# Patient Record
Sex: Male | Born: 1945 | Race: White | Hispanic: No | Marital: Married
Health system: Southern US, Community
[De-identification: ages and names within clinical notes are randomized; demographics above are authoritative.]

## PROBLEM LIST (undated history)

## (undated) DIAGNOSIS — G35 Multiple sclerosis: Secondary | ICD-10-CM

## (undated) DIAGNOSIS — F329 Major depressive disorder, single episode, unspecified: Secondary | ICD-10-CM

## (undated) DIAGNOSIS — M109 Gout, unspecified: Secondary | ICD-10-CM

## (undated) DIAGNOSIS — N200 Calculus of kidney: Secondary | ICD-10-CM

## (undated) DIAGNOSIS — F32A Depression, unspecified: Secondary | ICD-10-CM

## (undated) HISTORY — PX: HERNIA REPAIR: SHX51

## (undated) HISTORY — PX: EYE SURGERY: SHX253

## (undated) HISTORY — PX: TONSILLECTOMY: SUR1361

---

## 2016-05-23 ENCOUNTER — Emergency Department (HOSPITAL_BASED_OUTPATIENT_CLINIC_OR_DEPARTMENT_OTHER): Payer: Medicare HMO

## 2016-05-23 ENCOUNTER — Encounter (HOSPITAL_BASED_OUTPATIENT_CLINIC_OR_DEPARTMENT_OTHER): Payer: Self-pay | Admitting: Emergency Medicine

## 2016-05-23 ENCOUNTER — Emergency Department (HOSPITAL_BASED_OUTPATIENT_CLINIC_OR_DEPARTMENT_OTHER)
Admission: EM | Admit: 2016-05-23 | Discharge: 2016-05-24 | Disposition: A | Discharge status: Home / Self Care | Payer: Medicare HMO | Attending: Emergency Medicine | Admitting: Emergency Medicine

## 2016-05-23 DIAGNOSIS — R55 Syncope and collapse: Secondary | ICD-10-CM

## 2016-05-23 DIAGNOSIS — F17210 Nicotine dependence, cigarettes, uncomplicated: Secondary | ICD-10-CM | POA: Insufficient documentation

## 2016-05-23 DIAGNOSIS — Z7982 Long term (current) use of aspirin: Secondary | ICD-10-CM | POA: Diagnosis not present

## 2016-05-23 DIAGNOSIS — S0990XA Unspecified injury of head, initial encounter: Secondary | ICD-10-CM | POA: Diagnosis present

## 2016-05-23 DIAGNOSIS — Y929 Unspecified place or not applicable: Secondary | ICD-10-CM | POA: Diagnosis not present

## 2016-05-23 DIAGNOSIS — Y999 Unspecified external cause status: Secondary | ICD-10-CM | POA: Diagnosis not present

## 2016-05-23 DIAGNOSIS — S0101XA Laceration without foreign body of scalp, initial encounter: Secondary | ICD-10-CM | POA: Diagnosis not present

## 2016-05-23 DIAGNOSIS — Y939 Activity, unspecified: Secondary | ICD-10-CM | POA: Diagnosis not present

## 2016-05-23 DIAGNOSIS — X58XXXA Exposure to other specified factors, initial encounter: Secondary | ICD-10-CM | POA: Insufficient documentation

## 2016-05-23 HISTORY — DX: Calculus of kidney: N20.0

## 2016-05-23 HISTORY — DX: Major depressive disorder, single episode, unspecified: F32.9

## 2016-05-23 HISTORY — DX: Depression, unspecified: F32.A

## 2016-05-23 LAB — CBC WITH DIFFERENTIAL/PLATELET
BASOS ABS: 0 10*3/uL (ref 0.0–0.1)
BASOS PCT: 0 %
EOS ABS: 0.1 10*3/uL (ref 0.0–0.7)
EOS PCT: 1 %
HCT: 44.5 % (ref 39.0–52.0)
Hemoglobin: 15.3 g/dL (ref 13.0–17.0)
LYMPHS PCT: 18 %
Lymphs Abs: 1.5 10*3/uL (ref 0.7–4.0)
MCH: 31.9 pg (ref 26.0–34.0)
MCHC: 34.4 g/dL (ref 30.0–36.0)
MCV: 92.9 fL (ref 78.0–100.0)
Monocytes Absolute: 0.7 10*3/uL (ref 0.1–1.0)
Monocytes Relative: 9 %
Neutro Abs: 6 10*3/uL (ref 1.7–7.7)
Neutrophils Relative %: 72 %
PLATELETS: 230 10*3/uL (ref 150–400)
RBC: 4.79 MIL/uL (ref 4.22–5.81)
RDW: 13.8 % (ref 11.5–15.5)
WBC: 8.4 10*3/uL (ref 4.0–10.5)

## 2016-05-23 MED ORDER — LIDOCAINE HCL 1 % IJ SOLN
INTRAMUSCULAR | Status: AC
  Filled 2016-05-23: qty 10

## 2016-05-23 MED ORDER — LIDOCAINE HCL 1 % IJ SOLN: 10.0000 mL | Freq: Once | INTRAMUSCULAR | Status: DC

## 2016-05-23 NOTE — ED Provider Notes (Signed)
[facility] Provider Note   CSN: [id number] Arrival date & time: 05/23/16  2154   By signing my name below, I, Kevin Moyer, attest that this documentation has been prepared under the direction and in the presence of Geoffery Lyonsouglas Ladonna Vanorder, MD  Electronically Signed: Clovis PuAvnee Moyer, ED Scribe. 05/23/16. 11:24 PM.   History   Chief Complaint Chief Complaint  Patient presents with  . Loss of Consciousness   The history is provided by the patient and the spouse. No language interpreter was used.  Loss of Consciousness   This is a new problem. The current episode started 1 to 2 hours ago. Length of episode of loss of consciousness: unkown amount of time. The problem is associated with an unknown factor. Pertinent negatives include headaches. He has tried nothing for the symptoms. The treatment provided no relief.   HPI Comments:  Kevin Moyer is a 71 y.o. male who presents to the Emergency Department complaining of an acute onset, moderate laceration to his head s/p a syncopal episode which occurred PTA. Wife reports she heard a thump, thinks the pt hit his head on the nightstand and found him in the bedroom as he was tearing a rag to apply to his head. Pt does not remember what happened and states he awoke to find blood on his pillow. No alleviating factors noted. Pt denies a headache, neck pain, any other pain, blood thinner use and any other associated symptoms. No known drug allergies. Tetanus status unknown.   Past Medical History:  Diagnosis Date  . Depression   . Kidney calculi     There are no active problems to display for this patient.   Past Surgical History:  Procedure Laterality Date  . EYE SURGERY Bilateral     Home Medications    Prior to Admission medications   Medication Sig Start Date End Date Taking? Authorizing Provider  allopurinol (ZYLOPRIM) 100 MG tablet Take 100 mg by mouth daily.   Yes Historical Provider, MD  aspirin 81 MG chewable tablet Chew by  mouth daily.   Yes Historical Provider, MD  citalopram (CELEXA) 10 MG tablet Take 10 mg by mouth daily.   Yes Historical Provider, MD  pravastatin (PRAVACHOL) 10 MG tablet Take 10 mg by mouth daily.   Yes Historical Provider, MD  traZODone (DESYREL) 150 MG tablet Take by mouth at bedtime.   Yes Historical Provider, MD    Family History No family history on file.  Social History Social History  Substance Use Topics  . Smoking status: Current Every Day Smoker    Packs/day: 1.00    Types: Cigarettes  . Smokeless tobacco: Never Used  . Alcohol use Yes     Comment: daily     Allergies   Patient has no known allergies.   Review of Systems Review of Systems  Cardiovascular: Positive for syncope.  Musculoskeletal: Negative for neck pain.  Skin: Positive for wound.  Neurological: Positive for syncope. Negative for headaches.  All other systems reviewed and are negative.  Physical Exam Updated Vital Signs BP 138/88 (BP Location: Right Arm)   Pulse 92   Temp 97.9 F (36.6 C) (Oral)   Resp 19   Ht 5\' 8"  (1.727 m)   Wt 175 lb (79.4 kg)   SpO2 97%   BMI 26.61 kg/m   Physical Exam  Constitutional: He is oriented to person, place, and time. He appears well-developed and well-nourished.  HENT:  Head: Normocephalic.  6 cm laceration to the occiput oriented  vertically.   Eyes: EOM are normal. Pupils are equal, round, and reactive to light.  Neck: Normal range of motion.  No c-spine tenderness or step offs. Painless ROM in all directions  Cardiovascular: Normal rate, regular rhythm, normal heart sounds and intact distal pulses.   Pulmonary/Chest: Effort normal and breath sounds normal. No respiratory distress.  Abdominal: Soft. He exhibits no distension. There is no tenderness.  Musculoskeletal: Normal range of motion.  Neurological: He is alert and oriented to person, place, and time. No cranial nerve deficit. He exhibits normal muscle tone. Coordination normal.  Skin: Skin is  warm and dry.  Psychiatric: He has a normal mood and affect. Judgment normal.  Nursing note and vitals reviewed.  ED Treatments / Results  DIAGNOSTIC STUDIES:  Oxygen Saturation is 97% on RA, normal by my interpretation.    COORDINATION OF CARE:  11:00 PM Discussed treatment plan with pt at bedside and pt agreed to plan.  Labs (all labs ordered are listed, but only abnormal results are displayed) Labs Reviewed - No data to display  EKG  EKG Interpretation  Date/Time:  Saturday May 23 2016 22:07:45 EST Ventricular Rate:  89 PR Interval:  140 QRS Duration: 136 QT Interval:  416 QTC Calculation: 506 R Axis:   103 Text Interpretation:  Normal sinus rhythm Right bundle branch block Abnormal ECG Confirmed by Ahmira Boisselle  MD, Adalei Novell ([id number]) on 05/24/2016 12:08:32 AM       Radiology No results found.  Procedures .Marland KitchenLaceration Repair Date/Time: 05/23/2016 11:05 PM Performed by: Geoffery Lyons Authorized by: Geoffery Lyons   Consent:    Consent obtained:  Verbal   Consent given by:  Patient Anesthesia (see MAR for exact dosages):    Anesthesia method:  Local infiltration   Local anesthetic:  Lidocaine 1% w/o epi Laceration details:    Location:  Scalp   Scalp location:  Occipital   Length (cm):  6 Repair type:    Repair type:  Simple Pre-procedure details:    Preparation:  Patient was prepped and draped in usual sterile fashion Exploration:    Wound exploration: wound explored through full range of motion     Contaminated: no   Treatment:    Area cleansed with:  Betadine   Amount of cleaning:  Standard   Visualized foreign bodies/material removed: no   Skin repair:    Repair method:  Staples   Number of staples:  5 Approximation:    Approximation:  Close Post-procedure details:    Patient tolerance of procedure:  Tolerated well, no immediate complications    (including critical care time)  Medications Ordered in ED Medications  lidocaine (XYLOCAINE) 1 % (with  pres) injection 10 mL (not administered)     Initial Impression / Assessment and Plan / ED Course  I have reviewed the triage vital signs and the nursing notes.  Pertinent labs & imaging results that were available during my care of the patient were reviewed by me and considered in my medical decision making (see chart for details).  Patient presents with scalp laceration he sustained when hitting his head on the nightstand. He does not recall specifically what happened. He is neurologically intact in head CT is negative. Laboratory studies are unremarkable. He will be discharged, return as needed for any problems. Patient is here with his wife who will observe him. If he experiences further episodes or other problems, he will return for re-evaluation.  Final Clinical Impressions(s) / ED Diagnoses   Final diagnoses:  None  New Prescriptions New Prescriptions   No medications on file  I personally performed the services described in this documentation, which was scribed in my presence. The recorded information has been reviewed and is accurate.        Geoffery Lyons, MD 05/24/16 0010

## 2016-05-23 NOTE — ED Triage Notes (Signed)
Wife heard a "crash " and then found husband in BR and does not remember hitting head . Laceration noted to post head, jagged , bleeding controlled. Drsg applied. + LOC

## 2016-05-24 LAB — BASIC METABOLIC PANEL
Anion gap: 5 (ref 5–15)
BUN: 20 mg/dL (ref 6–20)
CHLORIDE: 107 mmol/L (ref 101–111)
CO2: 27 mmol/L (ref 22–32)
CREATININE: 1.04 mg/dL (ref 0.61–1.24)
Calcium: 9.4 mg/dL (ref 8.9–10.3)
GFR calc Af Amer: >60 mL/min (ref >60)
GFR calc non Af Amer: >60 mL/min (ref >60)
GLUCOSE: 106 mg/dL — AB (ref 65–99)
Potassium: 4.5 mmol/L (ref 3.5–5.1)
Sodium: 139 mmol/L (ref 135–145)

## 2016-05-24 NOTE — Discharge Instructions (Signed)
Staples are to remain in place for 1 week. Please follow-up with your doctor for removal.  Return to the emergency department for severe headache, redness around or pus draining from the wound, or other new and concerning symptoms.

## 2018-01-07 IMAGING — CT CT HEAD W/O CM
3 series · 15 of 47 positions shown, 18 images · non-contrast
Comparison: None.

CLINICAL DATA: Laceration.  Fall.

EXAM:
CT HEAD WITHOUT CONTRAST
TECHNIQUE: Contiguous axial images were obtained from the base of the skull
through the vertex without intravenous contrast.

[Series 2: head wo · axial · 0.49mm/px · z∈[+378,+508]mm · 9 of 32 slices shown, 12 images]
[im 3/32  brain]
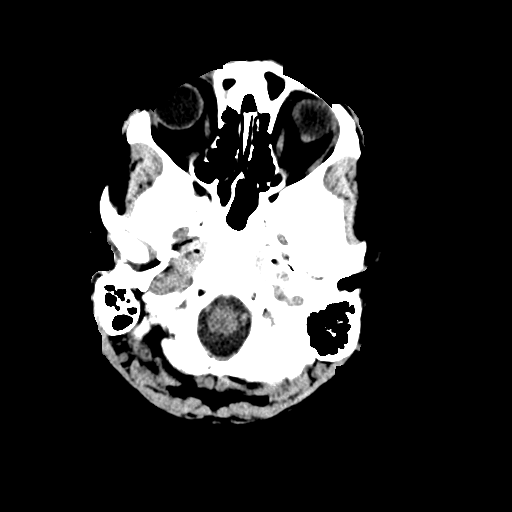
[im 3/32  bone]
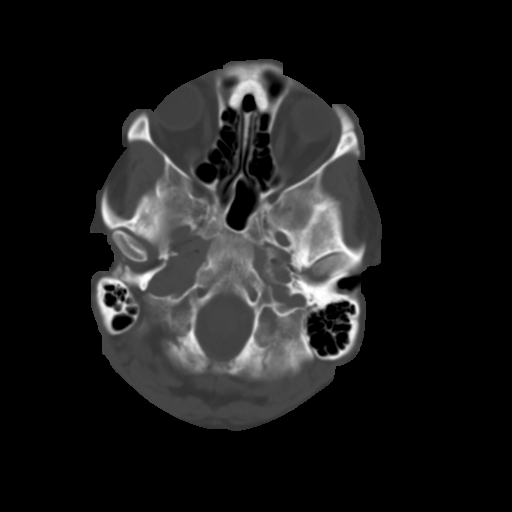
[im 6/32  brain]
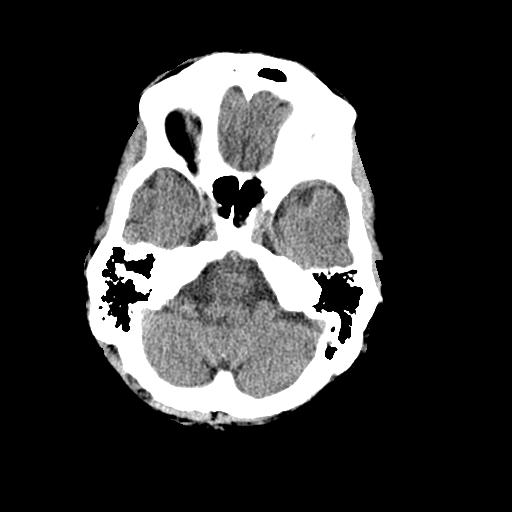
[im 9/32  brain]
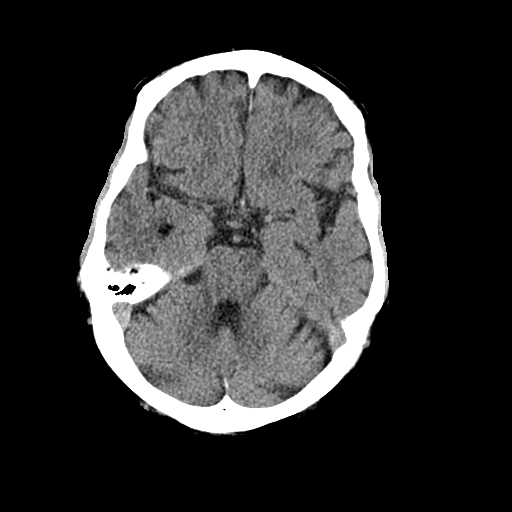
[im 12/32  brain]
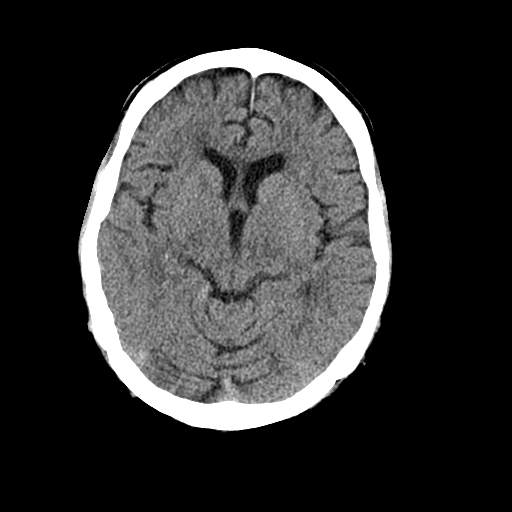
[im 17/32  brain]
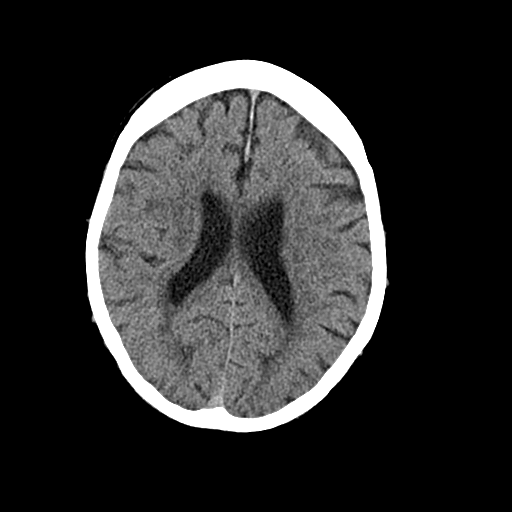
[im 17/32  bone]
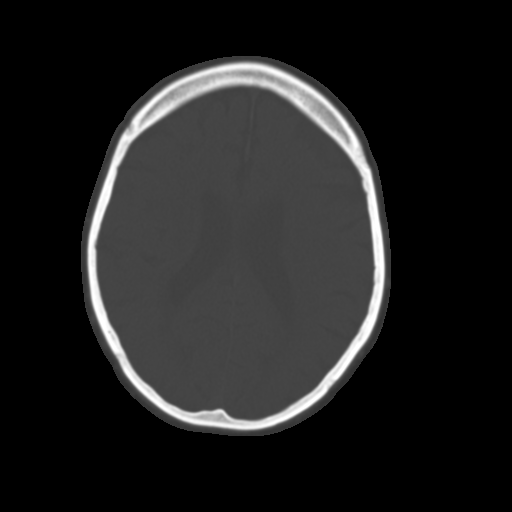
[im 20/32  brain]
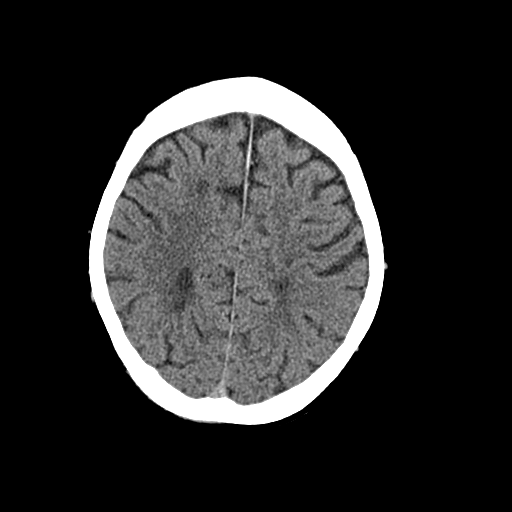
[im 23/32  brain]
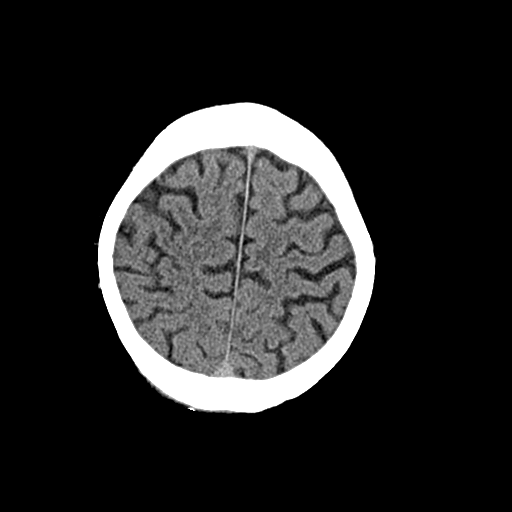
[im 26/32  brain]
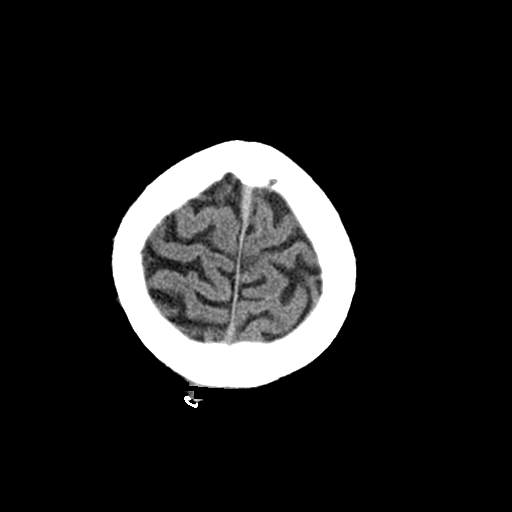
[im 29/32  brain]
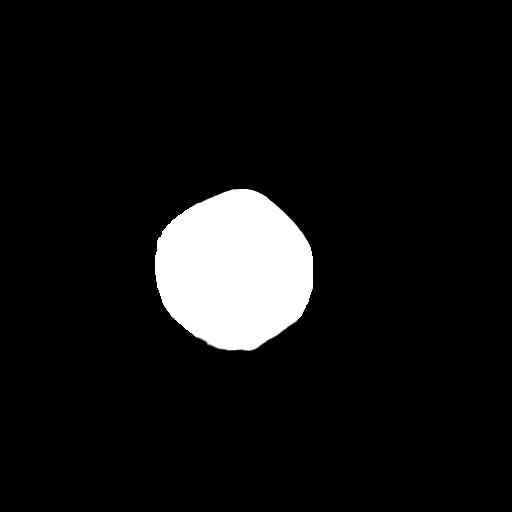
[im 29/32  bone]
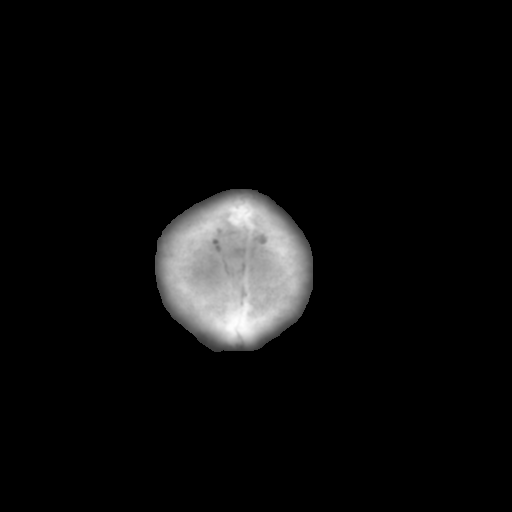

[Series 4: cor soft · coronal · 0.34mm/px · 3 of 84 slices shown]
[im 28/84  brain]
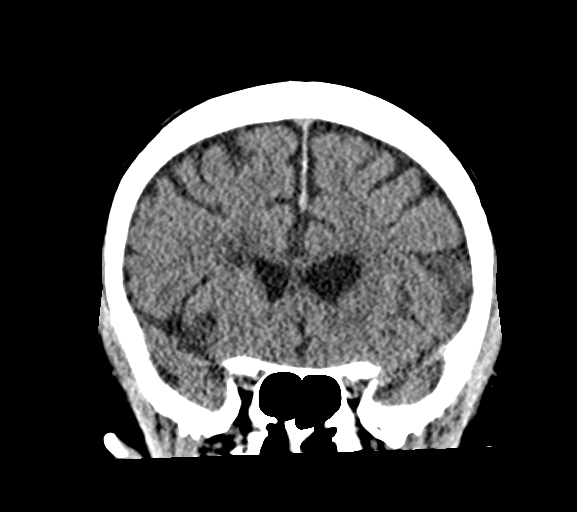
[im 37/84  brain]
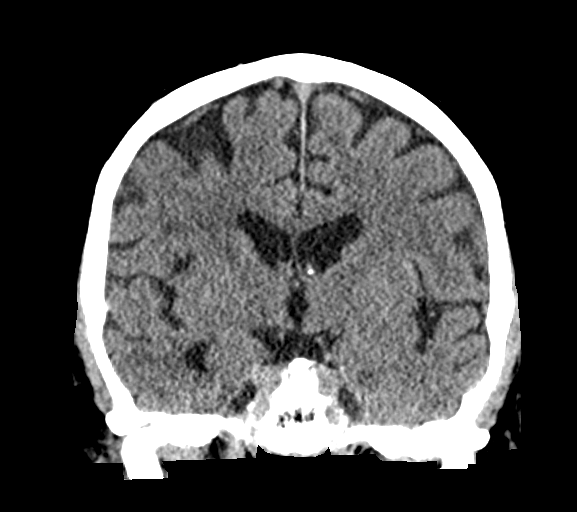
[im 47/84  brain]
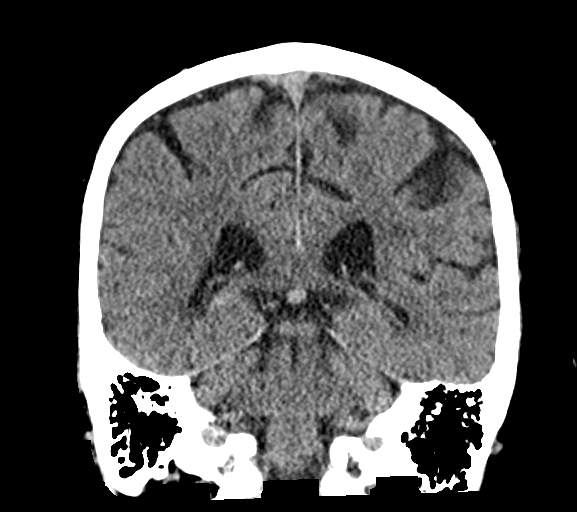

[Series 5: sag soft · sagittal · 0.35mm/px · 3 of 63 slices shown]
[im 21/63  brain]
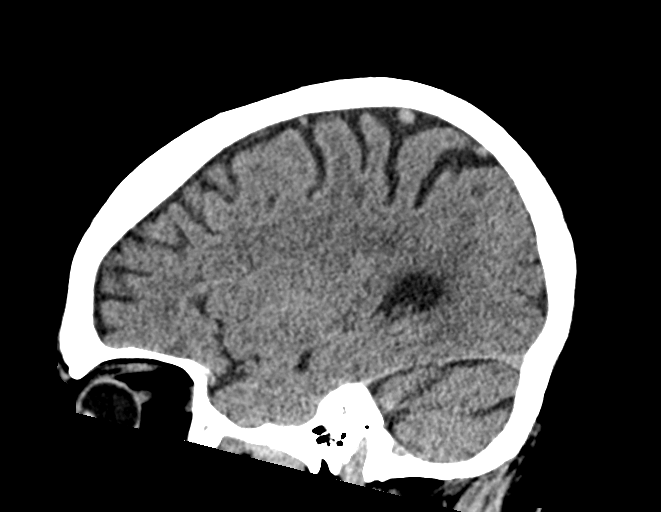
[im 32/63  brain]
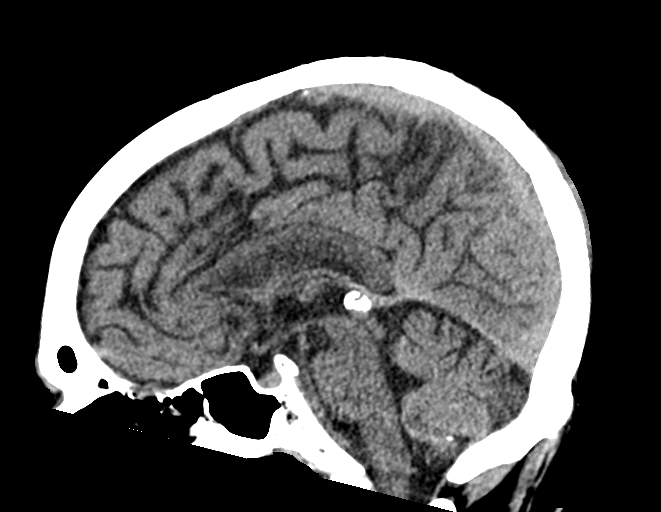
[im 42/63  brain]
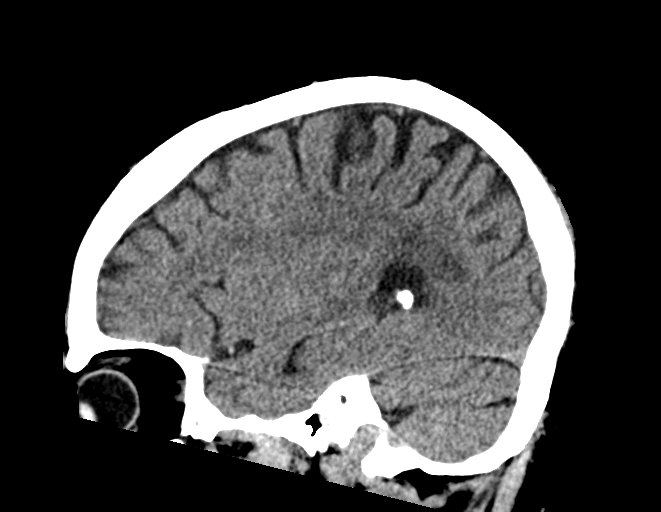

[15 of 47 positions shown; findings below may reference images not displayed]

FINDINGS: Brain: No subdural, epidural, or subarachnoid hemorrhage. The
cerebellum, brainstem, and basal cisterns are normal. Ventricles and
sulci are normal. Scattered white matter changes and lacunar
infarcts, particularly in the corona radiata, right greater than
left. No acute cortical ischemia or infarct identified. No mass,
mass effect, or midline shift.

Vascular: No hyperdense vessel or unexpected calcification.

Skull: Normal. Negative for fracture or focal lesion.

Sinuses/Orbits: No acute finding.

Other: Skin staples are seen over the posterior scalp, the right of
midline. Extracranial soft tissues are otherwise normal.
IMPRESSION: 1. No acute intracranial abnormality identified. White matter
changes as above.

## 2019-04-21 ENCOUNTER — Ambulatory Visit: Payer: Medicare HMO | Attending: Internal Medicine

## 2019-04-21 ENCOUNTER — Ambulatory Visit: Payer: Medicare HMO

## 2019-04-21 DIAGNOSIS — Z20822 Contact with and (suspected) exposure to covid-19: Secondary | ICD-10-CM | POA: Insufficient documentation

## 2019-04-22 LAB — NOVEL CORONAVIRUS, NAA: SARS-CoV-2, NAA: NOT DETECTED

## 2019-04-24 ENCOUNTER — Telehealth: Payer: Self-pay | Admitting: *Deleted

## 2019-04-24 NOTE — Telephone Encounter (Signed)
Pt had called for covid results. No indication of test performed.   NT called [employer], spoke with [employer], verbal given "Results, not detected." Attempted to CB pt, busy signal each attempt.

## 2019-04-24 NOTE — Telephone Encounter (Signed)
Attempted to reach at wife's number listed in demographics, VM not set up.

## 2019-04-25 ENCOUNTER — Telehealth: Payer: Self-pay | Admitting: General Practice

## 2019-04-25 NOTE — Telephone Encounter (Signed)
Negative COVID results given. Patient results "NOT Detected." Caller expressed understanding. ° °

## 2021-03-11 ENCOUNTER — Encounter (HOSPITAL_BASED_OUTPATIENT_CLINIC_OR_DEPARTMENT_OTHER): Payer: Self-pay

## 2021-03-11 ENCOUNTER — Emergency Department (HOSPITAL_BASED_OUTPATIENT_CLINIC_OR_DEPARTMENT_OTHER): Payer: Medicare HMO

## 2021-03-11 ENCOUNTER — Other Ambulatory Visit: Payer: Self-pay

## 2021-03-11 ENCOUNTER — Emergency Department (HOSPITAL_BASED_OUTPATIENT_CLINIC_OR_DEPARTMENT_OTHER)
Admission: EM | Admit: 2021-03-11 | Discharge: 2021-03-11 | Disposition: A | Discharge status: Home / Self Care | Payer: Medicare HMO | Attending: Emergency Medicine | Admitting: Emergency Medicine

## 2021-03-11 DIAGNOSIS — U071 COVID-19: Secondary | ICD-10-CM | POA: Diagnosis not present

## 2021-03-11 DIAGNOSIS — J069 Acute upper respiratory infection, unspecified: Secondary | ICD-10-CM

## 2021-03-11 DIAGNOSIS — F17210 Nicotine dependence, cigarettes, uncomplicated: Secondary | ICD-10-CM | POA: Insufficient documentation

## 2021-03-11 DIAGNOSIS — R0981 Nasal congestion: Secondary | ICD-10-CM | POA: Diagnosis present

## 2021-03-11 HISTORY — DX: Multiple sclerosis: G35

## 2021-03-11 LAB — RESP PANEL BY RT-PCR (FLU A&B, COVID) ARPGX2
Influenza A by PCR: NEGATIVE
Influenza B by PCR: NEGATIVE
SARS Coronavirus 2 by RT PCR: POSITIVE — AB

## 2021-03-11 MED ORDER — FLUTICASONE PROPIONATE 50 MCG/ACT NA SUSP: 2.0000 | Freq: Every day | NASAL | 0 refills | Status: AC

## 2021-03-11 MED ORDER — BENZONATATE 100 MG PO CAPS: 100.0000 mg | ORAL_CAPSULE | Freq: Three times a day (TID) | ORAL | 0 refills | Status: AC | PRN

## 2021-03-11 NOTE — ED Provider Notes (Signed)
Emergency Department Provider Note   I have reviewed the triage vital signs and the nursing notes.   HISTORY  Chief Complaint Cough   HPI Kevin Moyer is a 75 y.o. male with past medical history reviewed below presents emergency department with congestion along with cough and weakness.  Symptoms have been developing over the past 7 days.  Patient describes some subjective fever and chills along with weakness.  He is not feeling chest pain or shortness of breath.  No vomiting or diarrhea symptoms.  No radiation and no symptoms or other modifying factors.   Past Medical History:  Diagnosis Date   Depression    Kidney calculi    Multiple sclerosis (HCC)     There are no problems to display for this patient.   Past Surgical History:  Procedure Laterality Date   EYE SURGERY Bilateral     Allergies Patient has no known allergies.  No family history on file.  Social History Social History   Tobacco Use   Smoking status: Every Day    Packs/day: 1.00    Types: Cigarettes   Smokeless tobacco: Never  Substance Use Topics   Alcohol use: Yes    Comment: daily   Drug use: No    Review of Systems  Constitutional: Positive subjective fever/chills and weakness.  Eyes: No visual changes. ENT: No sore throat. Positive congestion.  Cardiovascular: Denies chest pain. Respiratory: Denies shortness of breath. Positive cough.  Gastrointestinal: No abdominal pain.  No nausea, no vomiting.  No diarrhea.  No constipation. Genitourinary: Negative for dysuria. Musculoskeletal: Negative for back pain. Skin: Negative for rash. Neurological: Negative for headaches, focal weakness or numbness.  10-point ROS otherwise negative.  ____________________________________________   PHYSICAL EXAM:  VITAL SIGNS: ED Triage Vitals  Enc Vitals Group     BP 03/11/21 0910 127/84     Pulse Rate 03/11/21 0910 88     Resp 03/11/21 0910 18     Temp 03/11/21 0910 98.5 F (36.9 C)      Temp Source 03/11/21 0910 Oral     SpO2 03/11/21 0910 100 %     Weight 03/11/21 0912 154 lb (69.9 kg)     Height 03/11/21 0912 5\' 8"  (1.727 m)   Constitutional: Alert and oriented. Well appearing and in no acute distress. Eyes: Conjunctivae are normal.  Head: Atraumatic. Nose: No congestion/rhinnorhea. Mouth/Throat: Mucous membranes are moist.   Neck: No stridor.  Cardiovascular: Normal rate, regular rhythm. Good peripheral circulation. Grossly normal heart sounds.   Respiratory: Normal respiratory effort.  No retractions. Lungs CTAB. Gastrointestinal: Soft and nontender. No distention.  Musculoskeletal: No lower extremity tenderness nor edema. No gross deformities of extremities. Neurologic:  Normal speech and language. No gross focal neurologic deficits are appreciated.  Skin:  Skin is warm, dry and intact. No rash noted.  ____________________________________________   LABS (all labs ordered are listed, but only abnormal results are displayed)  Labs Reviewed  RESP PANEL BY RT-PCR (FLU A&B, COVID) ARPGX2 - Abnormal; Notable for the following components:      Result Value   SARS Coronavirus 2 by RT PCR POSITIVE (*)    All other components within normal limits   ____________________________________________  RADIOLOGY  DG Chest Portable 1 View  Result Date: 03/11/2021 CLINICAL DATA:  productive cough EXAM: PORTABLE CHEST 1 VIEW COMPARISON:  11/27/2014 chest radiograph. FINDINGS: Stable cardiomediastinal silhouette with normal heart size. No pneumothorax. No pleural effusion. Lungs appear clear, with no acute consolidative airspace disease and no  pulmonary edema. IMPRESSION: No active disease. Electronically Signed   By: Delbert Phenix M.D.   On: 03/11/2021 10:00    ____________________________________________   PROCEDURES  Procedure(s) performed:   Procedures  None  ____________________________________________   INITIAL IMPRESSION / ASSESSMENT AND PLAN / ED  COURSE  Pertinent labs & imaging results that were available during my care of the patient were reviewed by me and considered in my medical decision making (see chart for details).   Patient presents emergency department with mild URI symptoms and flulike illness.  Chest x-ray reviewed showing no infiltrate to suggest community-acquired pneumonia.  We will send viral testing for COVID and flu which the patient will follow in the MyChart app.  He is on day 7 of symptoms and so is outside the window to benefit from antiviral therapy although at his age and with risk factors he would be a candidate if he had presented earlier.  He is in no acute distress and oxygen saturations are normal.  Plan for continued supportive care and quarantine at home pending the test results.   ____________________________________________  FINAL CLINICAL IMPRESSION(S) / ED DIAGNOSES  Final diagnoses:  Viral URI with cough    NEW OUTPATIENT MEDICATIONS STARTED DURING THIS VISIT:  Discharge Medication List as of 03/11/2021 10:23 AM     START taking these medications   Details  benzonatate (TESSALON) 100 MG capsule Take 1 capsule (100 mg total) by mouth 3 (three) times daily as needed for cough., Starting Tue 03/11/2021, Normal    fluticasone (FLONASE) 50 MCG/ACT nasal spray Place 2 sprays into both nostrils daily for 7 days., Starting Tue 03/11/2021, Until Tue 03/18/2021, Normal        Note:  This document was prepared using Dragon voice recognition software and may include unintentional dictation errors.  Alona Bene, MD, [facility]    Youssouf Shipley, Arlyss Repress, MD 03/12/21 [phone]

## 2021-03-11 NOTE — ED Triage Notes (Incomplete)
Congestion & cough x1 week Minimal relief with OTC meds

## 2021-03-11 NOTE — Discharge Instructions (Signed)
You are seen in the emerge department today with viral respiratory symptoms.  We are sending COVID and flu testing and we will get those results back later this afternoon.  The results will be available in the MyChart app.  Please follow closely with these test results and continue supportive care at home.  Return with any new or suddenly worsening symptoms.

## 2022-10-26 IMAGING — DX DG CHEST 1V PORT
2 series · 2 of 2 positions shown · non-contrast
Comparison: 11/27/2014 chest radiograph.

CLINICAL DATA: productive cough

EXAM:
PORTABLE CHEST 1 VIEW

[chest ap (1 of 2)]
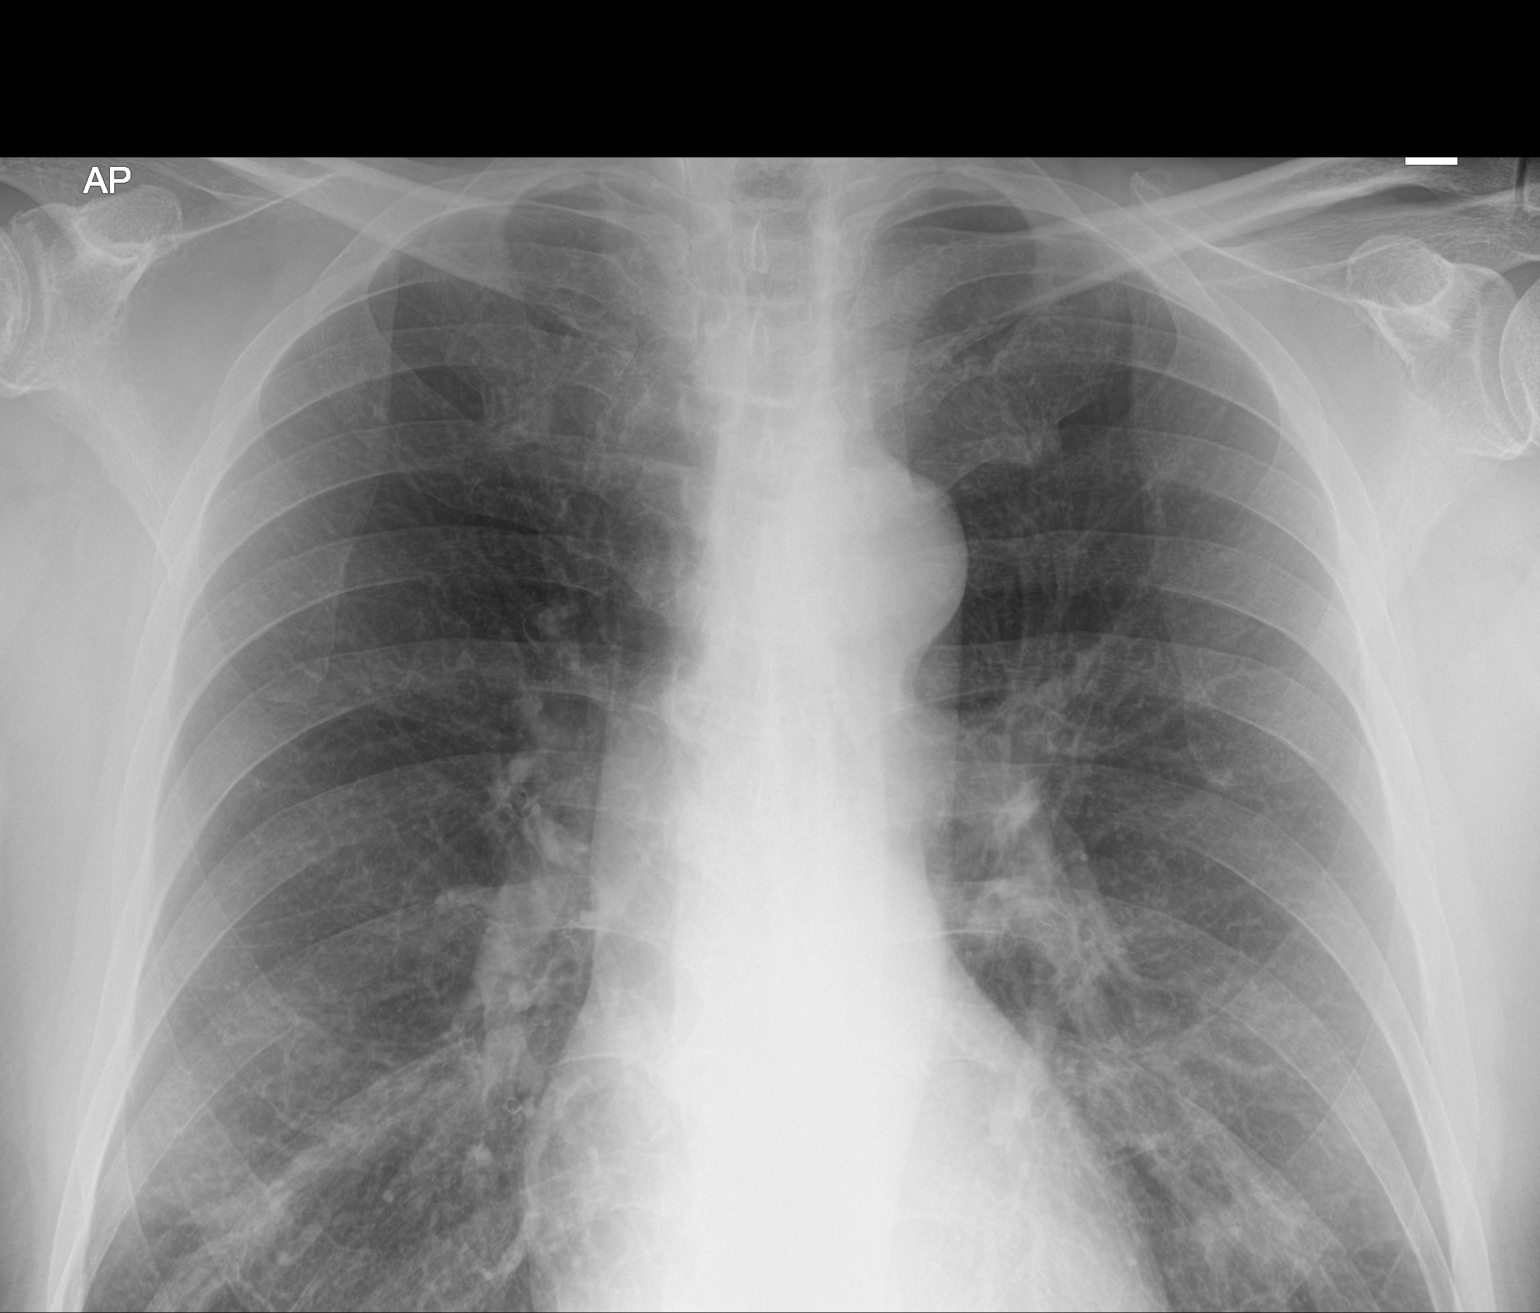

[chest ap (2 of 2)]
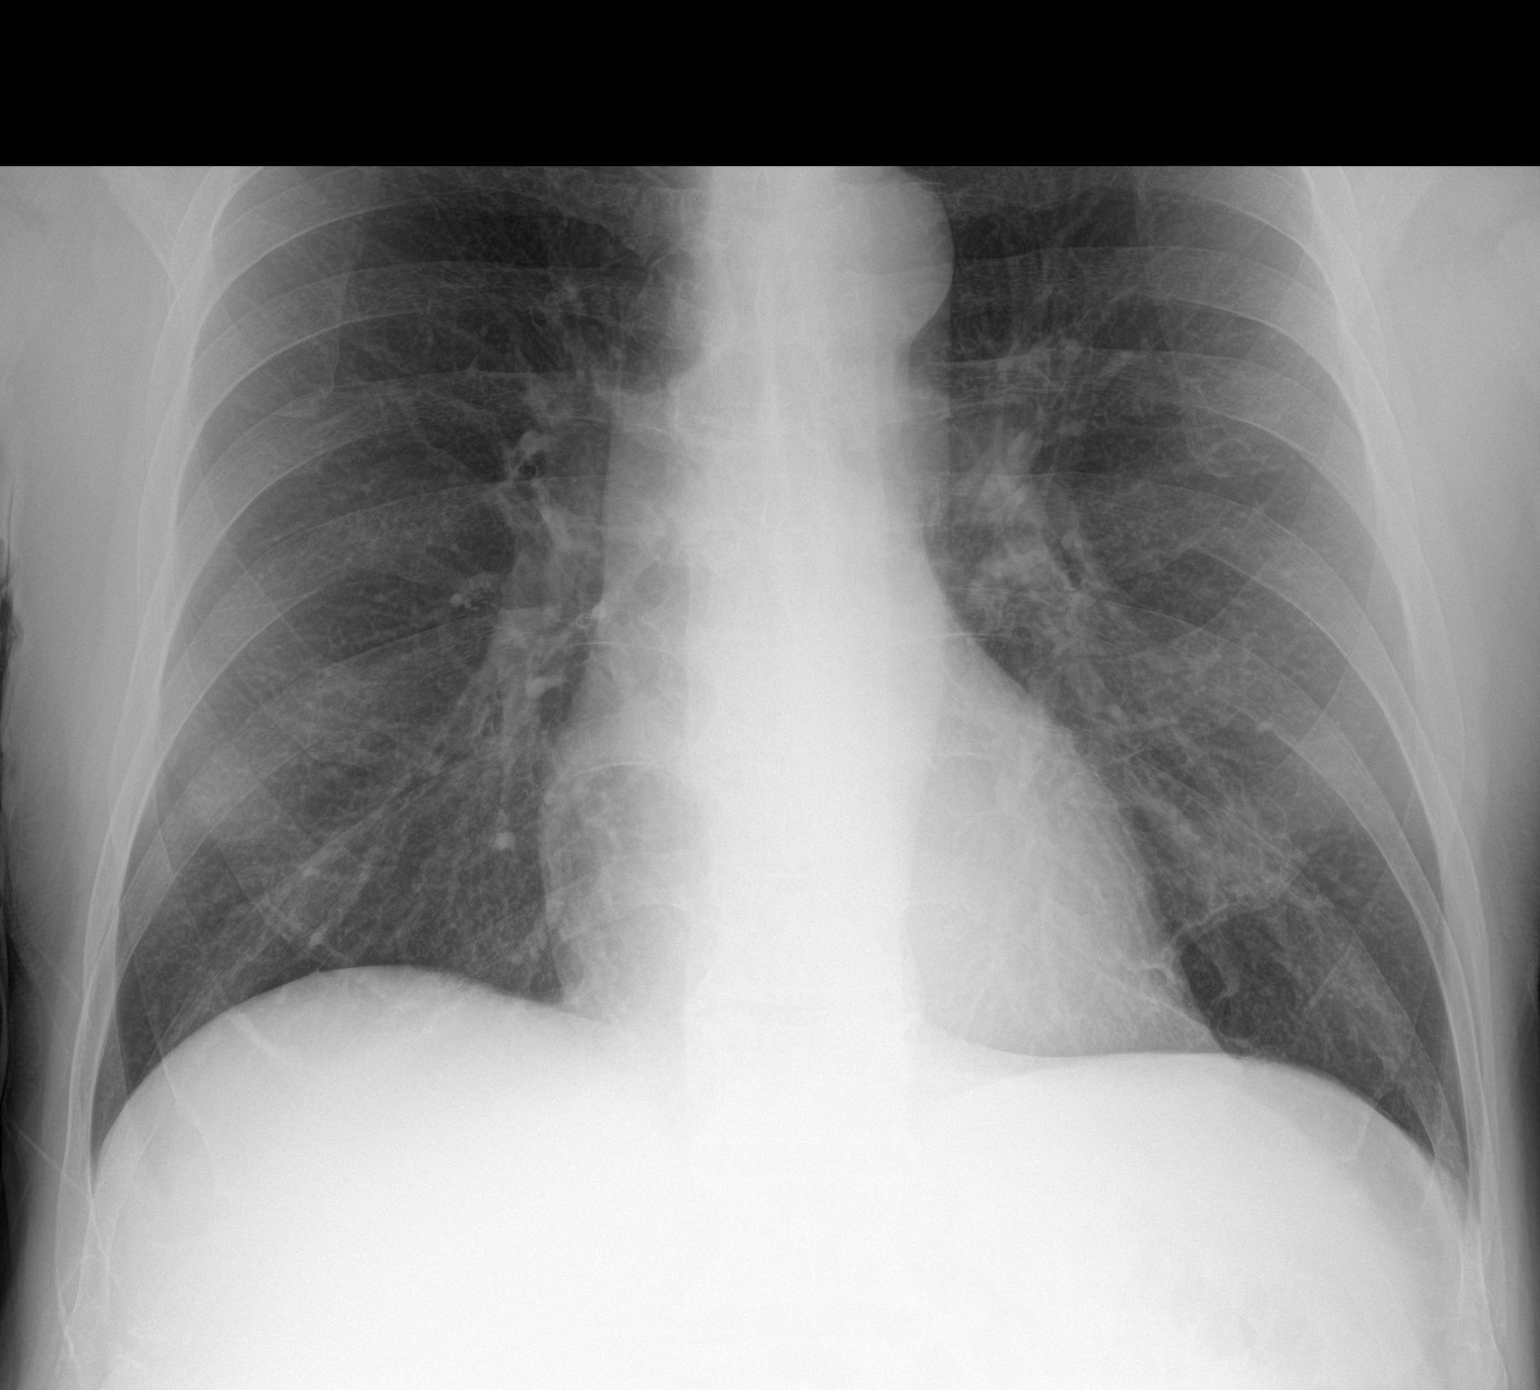

[2 of 2 positions shown; findings below may reference images not displayed]

FINDINGS: Stable cardiomediastinal silhouette with normal heart size. No
pneumothorax. No pleural effusion. Lungs appear clear, with no acute
consolidative airspace disease and no pulmonary edema.
IMPRESSION: No active disease.

## 2023-04-21 ENCOUNTER — Encounter: Payer: Self-pay | Admitting: Emergency Medicine

## 2023-04-21 ENCOUNTER — Ambulatory Visit: Payer: No Typology Code available for payment source | Admitting: Emergency Medicine

## 2023-04-21 VITALS — BP 130/76 | HR 57 | Ht 68.0 in | Wt 168.4 lb

## 2023-04-21 DIAGNOSIS — F329 Major depressive disorder, single episode, unspecified: Secondary | ICD-10-CM

## 2023-04-21 DIAGNOSIS — G35D Multiple sclerosis, unspecified: Secondary | ICD-10-CM | POA: Insufficient documentation

## 2023-04-21 DIAGNOSIS — R911 Solitary pulmonary nodule: Secondary | ICD-10-CM | POA: Insufficient documentation

## 2023-04-21 DIAGNOSIS — F32A Depression, unspecified: Secondary | ICD-10-CM | POA: Insufficient documentation

## 2023-04-21 DIAGNOSIS — G35 Multiple sclerosis: Secondary | ICD-10-CM

## 2023-04-21 NOTE — Assessment & Plan Note (Signed)
Depression No details provided in the conversation. -Continue current management as per primary care provider or psychiatrist.

## 2023-04-21 NOTE — Assessment & Plan Note (Signed)
Right Upper Lobe Pulmonary Nodule Incidental finding on MRI of thoracic spine for MS surveillance. CT chest confirmed a 2.2 cm right upper lobe nodule with associated satellite nodule, concerning for possible malignancy. High pretest suspicion for early stage lung cancer given 50 pack-year smoking history. No prior lung cancer screening CTs. No symptoms of cough or dyspnea. No history of TB or significant occupational exposures. Family history of lung cancer in sister. -Schedule bronchoscopy on 04/26/2023 to obtain tissue biopsy for histological confirmation and staging. -Stop aspirin prior to bronchoscopy. -Await PET scan results from [facility] for further staging and to guide definitive treatment plan.

## 2023-04-21 NOTE — H&P (View-Only) (Signed)
 Subjective:    Patient ID: Kevin Moyer, male    DOB: January 31, 1946, 78 y.o.   MRN: [id number]  HPI The patient is a 78 year old male who presents with an abnormal CT scan of the chest. He is accompanied by his daughter, Wynona Canes. He was referred from the [place] for an abnormal CT scan of the chest.  A CT scan performed on April 06, 2023, revealed a 2.2 cm right upper lobe nodule with an associated satellite nodule. This finding was incidental, discovered during an MRI of the thoracic spine conducted for MS surveillance. He has no prior history of lung nodules and was scheduled for lung cancer screening in May, which would have been his first.  He has a significant smoking history, having smoked a pack a day for 50 years, but quit smoking two weeks prior to the discovery of the nodule. He has no history of COPD or breathing difficulties and is not on any inhaled medications. No significant cough and maintains good functional capacity, including daily gym visits and swimming.  His past medical history includes multiple sclerosis (MS), diagnosed eight years ago, with no flare-ups since diagnosis. The recent MRI confirmed the presence of MS but showed it to be stable. He also has a history of depression and renal calculi.  Family history is notable for his father who died of lung cancer, a sister who died of pancreatic cancer, and another sister who had lung cancer and underwent partial lung resection.  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 He reports no significant occupational exposures to fumes, dust, chemicals, or solvents. He has traveled extensively, including to [place], but reports no known exposures to harmful substances during his travels. He has no history of tuberculosis or positive TB tests.  Review of systems is negative for breathing trouble, significant cough, or any symptoms suggestive of  respiratory distress. He is not on any medications for respiratory issues.   RADIOLOGY CT chest: 2.2 cm right upper lobe nodule with associated satellite nodule (04/06/2023) MRI thoracic spine: Right upper lobe mass (04/06/2023)   Review of Systems As per HPI  Past Medical History:  Diagnosis Date   Depression    Kidney calculi    Multiple sclerosis (HCC)      Family Hx: Family history is notable for his father who died of lung cancer, a sister who died of pancreatic cancer, and another sister who had lung cancer and underwent partial lung resection. - Father (lung cancer) - Father (melanoma) - Sister (pancreatic cancer) - Sister (lung cancer, had lobectomy) - Mother (sarcoidosis)  Social History   Socioeconomic History   Marital status: Married    Spouse name: Not on file   Number of children: Not on file   Years of education: Not on file   Highest education level: Not on file  Occupational History   Not on file  Tobacco Use   Smoking status: Former    Types: Cigarettes   Smokeless tobacco: Never   Tobacco comments:    Quit smoking 1/25  Substance and Sexual Activity   Alcohol use: Yes    Comment: daily   Drug use: No   Sexual activity: Not on file  Other Topics Concern   Not on file  Social History Narrative   Not on file   Social Drivers of Health   Financial Resource Strain: Not on file  Food Insecurity: Not  on file  Transportation Needs: Not on file  Physical Activity: Not on file  Stress: Not on file  Social Connections: Not on file  Intimate Partner Violence: Not on file     No Known Allergies   Outpatient Medications Prior to Visit  Medication Sig Dispense Refill   allopurinol (ZYLOPRIM) 100 MG tablet Take 100 mg by mouth daily.     buPROPion (WELLBUTRIN XL) 150 MG 24 hr tablet Take 150 mg by mouth daily.     citalopram (CELEXA) 10 MG tablet Take 10 mg by mouth daily.     pravastatin (PRAVACHOL) 10 MG tablet Take 10 mg by mouth daily.      tiZANidine (ZANAFLEX) 4 MG tablet Take 1 tablet by mouth at bedtime as needed.     traZODone (DESYREL) 150 MG tablet Take by mouth at bedtime.     aspirin 81 MG chewable tablet Chew by mouth daily. (Patient not taking: Reported on 04/21/2023)     benzonatate (TESSALON) 100 MG capsule Take 1 capsule (100 mg total) by mouth 3 (three) times daily as needed for cough. (Patient not taking: Reported on 04/21/2023) 21 capsule 0   fluticasone (FLONASE) 50 MCG/ACT nasal spray Place 2 sprays into both nostrils daily for 7 days. 1 g 0   No facility-administered medications prior to visit.        Objective:   Physical Exam Vitals:   04/21/23 1505  BP: 130/76  Pulse: (!) 57  SpO2: 96%  Weight: 168 lb 6.4 oz (76.4 kg)  Height: 5\' 8"  (1.727 m)    Gen: Pleasant, well-nourished, in no distress,  normal affect  ENT: No lesions,  mouth clear,  oropharynx clear, no postnasal drip  Neck: No JVD, no stridor  Lungs: No use of accessory muscles, no crackles or wheezing on normal respiration, no wheeze on forced expiration  Cardiovascular: RRR, heart sounds normal, no murmur or gallops, no peripheral edema  Musculoskeletal: No deformities, no cyanosis or clubbing  Neuro: alert, awake, non focal  Skin: Warm, no lesions or rash       Assessment & Plan:   Pulmonary nodule 1 cm or greater in diameter Right Upper Lobe Pulmonary Nodule Incidental finding on MRI of thoracic spine for MS surveillance. CT chest confirmed a 2.2 cm right upper lobe nodule with associated satellite nodule, concerning for possible malignancy. High pretest suspicion for early stage lung cancer given 50 pack-year smoking history. No prior lung cancer screening CTs. No symptoms of cough or dyspnea. No history of TB or significant occupational exposures. Family history of lung cancer in sister. -Schedule bronchoscopy on 04/26/2023 to obtain tissue biopsy for histological confirmation and staging. -Stop aspirin prior to  bronchoscopy. -Await PET scan results from [facility] for further staging and to guide definitive treatment plan.     Multiple sclerosis (HCC) Multiple Sclerosis Stable with no recent flare-ups. Recent MRI confirmed diagnosis. -Continue current management as per neurologist.  Depression Depression No details provided in the conversation. -Continue current management as per primary care provider or psychiatrist.   Levy Pupa, MD, PhD 04/21/2023, 4:48 PM [facility] [phone] or if no answer before 7:00PM call [phone] For any issues after 7:00PM please call eLink [phone]

## 2023-04-21 NOTE — Patient Instructions (Signed)
We reviewed your CT scan of the chest today. We will arrange for navigational bronchoscopy to evaluate a right upper lobe pulmonary nodule.  This will be done under general anesthesia as an outpatient at [facility] endoscopy.  Will try to get this scheduled for 04/26/2023. [facility] We will arrange for follow-up in our office to review your biopsy results.

## 2023-04-21 NOTE — Assessment & Plan Note (Signed)
Multiple Sclerosis Stable with no recent flare-ups. Recent MRI confirmed diagnosis. -Continue current management as per neurologist.

## 2023-04-21 NOTE — Progress Notes (Signed)
Subjective:    Patient ID: Kevin Moyer, male    DOB: January 31, 1946, 78 y.o.   MRN: [id number]  HPI The patient is a 78 year old male who presents with an abnormal CT scan of the chest. He is accompanied by his daughter, Wynona Canes. He was referred from the [place] for an abnormal CT scan of the chest.  A CT scan performed on April 06, 2023, revealed a 2.2 cm right upper lobe nodule with an associated satellite nodule. This finding was incidental, discovered during an MRI of the thoracic spine conducted for MS surveillance. He has no prior history of lung nodules and was scheduled for lung cancer screening in May, which would have been his first.  He has a significant smoking history, having smoked a pack a day for 50 years, but quit smoking two weeks prior to the discovery of the nodule. He has no history of COPD or breathing difficulties and is not on any inhaled medications. No significant cough and maintains good functional capacity, including daily gym visits and swimming.  His past medical history includes multiple sclerosis (MS), diagnosed eight years ago, with no flare-ups since diagnosis. The recent MRI confirmed the presence of MS but showed it to be stable. He also has a history of depression and renal calculi.  Family history is notable for his father who died of lung cancer, a sister who died of pancreatic cancer, and another sister who had lung cancer and underwent partial lung resection.  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 He reports no significant occupational exposures to fumes, dust, chemicals, or solvents. He has traveled extensively, including to [place], but reports no known exposures to harmful substances during his travels. He has no history of tuberculosis or positive TB tests.  Review of systems is negative for breathing trouble, significant cough, or any symptoms suggestive of  respiratory distress. He is not on any medications for respiratory issues.   RADIOLOGY CT chest: 2.2 cm right upper lobe nodule with associated satellite nodule (04/06/2023) MRI thoracic spine: Right upper lobe mass (04/06/2023)   Review of Systems As per HPI  Past Medical History:  Diagnosis Date   Depression    Kidney calculi    Multiple sclerosis (HCC)      Family Hx: Family history is notable for his father who died of lung cancer, a sister who died of pancreatic cancer, and another sister who had lung cancer and underwent partial lung resection. - Father (lung cancer) - Father (melanoma) - Sister (pancreatic cancer) - Sister (lung cancer, had lobectomy) - Mother (sarcoidosis)  Social History   Socioeconomic History   Marital status: Married    Spouse name: Not on file   Number of children: Not on file   Years of education: Not on file   Highest education level: Not on file  Occupational History   Not on file  Tobacco Use   Smoking status: Former    Types: Cigarettes   Smokeless tobacco: Never   Tobacco comments:    Quit smoking 1/25  Substance and Sexual Activity   Alcohol use: Yes    Comment: daily   Drug use: No   Sexual activity: Not on file  Other Topics Concern   Not on file  Social History Narrative   Not on file   Social Drivers of Health   Financial Resource Strain: Not on file  Food Insecurity: Not  on file  Transportation Needs: Not on file  Physical Activity: Not on file  Stress: Not on file  Social Connections: Not on file  Intimate Partner Violence: Not on file     No Known Allergies   Outpatient Medications Prior to Visit  Medication Sig Dispense Refill   allopurinol (ZYLOPRIM) 100 MG tablet Take 100 mg by mouth daily.     buPROPion (WELLBUTRIN XL) 150 MG 24 hr tablet Take 150 mg by mouth daily.     citalopram (CELEXA) 10 MG tablet Take 10 mg by mouth daily.     pravastatin (PRAVACHOL) 10 MG tablet Take 10 mg by mouth daily.      tiZANidine (ZANAFLEX) 4 MG tablet Take 1 tablet by mouth at bedtime as needed.     traZODone (DESYREL) 150 MG tablet Take by mouth at bedtime.     aspirin 81 MG chewable tablet Chew by mouth daily. (Patient not taking: Reported on 04/21/2023)     benzonatate (TESSALON) 100 MG capsule Take 1 capsule (100 mg total) by mouth 3 (three) times daily as needed for cough. (Patient not taking: Reported on 04/21/2023) 21 capsule 0   fluticasone (FLONASE) 50 MCG/ACT nasal spray Place 2 sprays into both nostrils daily for 7 days. 1 g 0   No facility-administered medications prior to visit.        Objective:   Physical Exam Vitals:   04/21/23 1505  BP: 130/76  Pulse: (!) 57  SpO2: 96%  Weight: 168 lb 6.4 oz (76.4 kg)  Height: 5\' 8"  (1.727 m)    Gen: Pleasant, well-nourished, in no distress,  normal affect  ENT: No lesions,  mouth clear,  oropharynx clear, no postnasal drip  Neck: No JVD, no stridor  Lungs: No use of accessory muscles, no crackles or wheezing on normal respiration, no wheeze on forced expiration  Cardiovascular: RRR, heart sounds normal, no murmur or gallops, no peripheral edema  Musculoskeletal: No deformities, no cyanosis or clubbing  Neuro: alert, awake, non focal  Skin: Warm, no lesions or rash       Assessment & Plan:   Pulmonary nodule 1 cm or greater in diameter Right Upper Lobe Pulmonary Nodule Incidental finding on MRI of thoracic spine for MS surveillance. CT chest confirmed a 2.2 cm right upper lobe nodule with associated satellite nodule, concerning for possible malignancy. High pretest suspicion for early stage lung cancer given 50 pack-year smoking history. No prior lung cancer screening CTs. No symptoms of cough or dyspnea. No history of TB or significant occupational exposures. Family history of lung cancer in sister. -Schedule bronchoscopy on 04/26/2023 to obtain tissue biopsy for histological confirmation and staging. -Stop aspirin prior to  bronchoscopy. -Await PET scan results from [facility] for further staging and to guide definitive treatment plan.     Multiple sclerosis (HCC) Multiple Sclerosis Stable with no recent flare-ups. Recent MRI confirmed diagnosis. -Continue current management as per neurologist.  Depression Depression No details provided in the conversation. -Continue current management as per primary care provider or psychiatrist.   Levy Pupa, MD, PhD 04/21/2023, 4:48 PM [facility] [phone] or if no answer before 7:00PM call [phone] For any issues after 7:00PM please call eLink [phone]

## 2023-04-22 ENCOUNTER — Telehealth: Payer: Self-pay | Admitting: Emergency Medicine

## 2023-04-22 NOTE — Telephone Encounter (Signed)
I spoke with the patient and asked him to be sure to bring his CT scan disc from the [place] for his procedure next week 04/26/2023 to facilitate navigation.  If for some reason the disc is not adequate then we will perform a super D CT that day.

## 2023-04-23 ENCOUNTER — Other Ambulatory Visit: Payer: Self-pay

## 2023-04-23 ENCOUNTER — Encounter (HOSPITAL_COMMUNITY): Payer: Self-pay | Admitting: Emergency Medicine

## 2023-04-23 NOTE — Progress Notes (Signed)
Anesthesia Chart Review: Same day workup  78yo male vet with PMH of MS not  on immunoomodifiers, HLD, CKD2, recent incidentally discovered RUL mass. He was undergoing workup by neurologist regarding potential hx MS, and RUL mass was found incidently. Lifelong smoker, quit 03/24/23, 50pk./yr hx. Per oncology note 04/19/2023 at the [facility], no pulm complaints, no cough, hemoptysis, weight loss. Goes to water aerobics daily. No significant cardiovascular dz hx.   He will need day of surgery labs and evaluation.  MRI brain 04/06/2023: Impression: 1. Extensive white matter disease consistent with multiple  sclerosis but no evidence of active demyelination.   MRI C-spine 04/06/2023: Impression: 1. Mild cervical spondylosis.   2. Abnormal medullary signal in the medullary pyramids and focal  cord signal abnormality at C2 and C3 consistent with multiple  sclerosis. No abnormal enhancement or evidence of active  demyelination.   MRI T-spine 04/06/2023: mpression: 1. 2 cm right lung mass. Dr. Pearlie Oyster notified of abnormal  finding and CT chest will be performed imminently.   2. Thoracic spondylosis with disc protrusions T8/T9 and lateral  recess stenosis T6/T7 and T7/T8.   3. No abnormal cord signal or cord enhancement.   CT chest 04/06/2023: Impression: 1. Confirmation of a 2.2 x 1.8 x 2.6 cm right upper lobe  enhancing soft tissue mass with irregular margins and subpleural  extension which is concerning for malignancy. Referral to  thoracic oncology for further management is recommended. 2. No thoracic adenopathy by size criteria on CT. Other ancillary  findings as above     Zannie Cove [facility]/Anesthesiology Phone [phone] 04/23/2023 10:30 AM

## 2023-04-23 NOTE — Anesthesia Preprocedure Evaluation (Signed)
Anesthesia Evaluation  Patient identified by MRN, date of birth, ID band Patient awake    Reviewed: Allergy & Precautions, NPO status , Patient's Chart, lab work & pertinent test results  Airway Mallampati: IV  TM Distance: >3 FB Neck ROM: Full    Dental  (+) Missing, Dental Advisory Given,    Pulmonary former smoker (50 pack year history, quit 03/24/23) RUL nodule  50 pack year history, quit 03/24/23   Pulmonary exam normal breath sounds clear to auscultation       Cardiovascular negative cardio ROS Normal cardiovascular exam Rhythm:Regular Rate:Normal     Neuro/Psych  PSYCHIATRIC DISORDERS  Depression     Neuromuscular disease (MS- not on any drugs currently)    GI/Hepatic negative GI ROS,,,(+)       alcohol useEtoh 2 drinks/day   Endo/Other  negative endocrine ROS    Renal/GU Renal InsufficiencyRenal disease  negative genitourinary   Musculoskeletal negative musculoskeletal ROS (+)    Abdominal   Peds  Hematology negative hematology ROS (+)   Anesthesia Other Findings 78yo male vet with PMH of MS not  on immunoomodifiers, HLD, CKD2, recent incidentally discovered RUL mas  Reproductive/Obstetrics negative OB ROS                             Anesthesia Physical Anesthesia Plan  ASA: 2  Anesthesia Plan: General   Post-op Pain Management: Minimal or no pain anticipated   Induction: Intravenous  PONV Risk Score and Plan: 2 and Dexamethasone and Ondansetron  Airway Management Planned: Oral ETT  Additional Equipment: None  Intra-op Plan:   Post-operative Plan: Extubation in OR  Informed Consent: I have reviewed the patients History and Physical, chart, labs and discussed the procedure including the risks, benefits and alternatives for the proposed anesthesia with the patient or authorized representative who has indicated his/her understanding and acceptance.     Dental advisory  given  Plan Discussed with: CRNA  Anesthesia Plan Comments: (  )        Anesthesia Quick Evaluation

## 2023-04-23 NOTE — Progress Notes (Addendum)
PCP - Ninetta Lights, MD/ [facility]  Chest x-ray - 10/28/22  Anesthesia review: Y  Patient verbally denies any shortness of breath, fever, cough and chest pain during phone call   -------------  SDW INSTRUCTIONS given:  Your procedure is scheduled on Monday, February, 3rd.  Report to [facility] Main Entrance "A" at 0830 A.M., and check in at the Admitting office.  Call this number if you have problems the morning of surgery:  [phone]   Remember:  Do not eat or drink after midnight the night before your surgery    Take these medicines the morning of surgery with A SIP OF WATER  allopurinol (ZYLOPRIM)  buPROPion (WELLBUTRIN XL)  citalopram (CELEXA)  pravastatin (PRAVACHOL)  Oxybutynin  As of today, STOP taking any Aspirin (unless otherwise instructed by your surgeon) Aleve, Naproxen, Ibuprofen, Motrin, Advil, Goody's, BC's, all herbal medications, fish oil, and all vitamins.                      Do not wear jewelry, make up, or nail polish            Do not wear lotions, powders, perfumes/colognes, or deodorant.            Do not shave 48 hours prior to surgery.  Men may shave face and neck.            Do not bring valuables to the hospital.            [facility] is not responsible for any belongings or valuables.  Do NOT Smoke (Tobacco/Vaping) 24 hours prior to your procedure If you use a CPAP at night, you may bring all equipment for your overnight stay.   Contacts, glasses, dentures or bridgework may not be worn into surgery.      For patients admitted to the hospital, discharge time will be determined by your treatment team.   Patients discharged the day of surgery will not be allowed to drive home, and someone needs to stay with them for 24 hours.    Special instructions:   [place]- Preparing For Surgery  Before surgery, you can play an important role. Because skin is not sterile, your skin needs to be as free of germs as possible. You can reduce the number of germs  on your skin by washing with CHG (chlorahexidine gluconate) Soap before surgery.  CHG is an antiseptic cleaner which kills germs and bonds with the skin to continue killing germs even after washing.    Oral Hygiene is also important to reduce your risk of infection.  Remember - BRUSH YOUR TEETH THE MORNING OF SURGERY WITH YOUR REGULAR TOOTHPASTE  Please do not use if you have an allergy to CHG or antibacterial soaps. If your skin becomes reddened/irritated stop using the CHG.  Do not shave (including legs and underarms) for at least 48 hours prior to first CHG shower. It is OK to shave your face.  Please follow these instructions carefully.   Shower the NIGHT BEFORE SURGERY and the MORNING OF SURGERY with DIAL Soap.   Pat yourself dry with a CLEAN TOWEL.  Wear CLEAN PAJAMAS to bed the night before surgery  Place CLEAN SHEETS on your bed the night of your first shower and DO NOT SLEEP WITH PETS.   Day of Surgery: Please shower morning of surgery  Wear Clean/Comfortable clothing the morning of surgery Do not apply any deodorants/lotions.   Remember to brush your teeth WITH YOUR REGULAR  TOOTHPASTE.   Questions were answered. Patient verbalized understanding of instructions.

## 2023-04-26 ENCOUNTER — Encounter (HOSPITAL_COMMUNITY)
Admission: RE | Disposition: A | Discharge status: Home / Self Care | Payer: Self-pay | Source: Ambulatory Visit | Attending: Emergency Medicine

## 2023-04-26 ENCOUNTER — Ambulatory Visit (HOSPITAL_COMMUNITY): Payer: Medicare HMO

## 2023-04-26 ENCOUNTER — Other Ambulatory Visit: Payer: Self-pay

## 2023-04-26 ENCOUNTER — Ambulatory Visit (HOSPITAL_COMMUNITY): Payer: Self-pay | Admitting: Physician Assistant

## 2023-04-26 ENCOUNTER — Ambulatory Visit (HOSPITAL_BASED_OUTPATIENT_CLINIC_OR_DEPARTMENT_OTHER): Payer: Medicare HMO | Admitting: Physician Assistant

## 2023-04-26 ENCOUNTER — Ambulatory Visit (HOSPITAL_COMMUNITY)
Admission: RE | Admit: 2023-04-26 | Discharge: 2023-04-26 | Disposition: A | Discharge status: Home / Self Care | Payer: Medicare HMO | Source: Ambulatory Visit | Attending: Emergency Medicine | Admitting: Emergency Medicine

## 2023-04-26 ENCOUNTER — Encounter (HOSPITAL_COMMUNITY): Payer: Self-pay | Admitting: Emergency Medicine

## 2023-04-26 DIAGNOSIS — R911 Solitary pulmonary nodule: Secondary | ICD-10-CM

## 2023-04-26 DIAGNOSIS — Z87891 Personal history of nicotine dependence: Secondary | ICD-10-CM | POA: Diagnosis not present

## 2023-04-26 DIAGNOSIS — E785 Hyperlipidemia, unspecified: Secondary | ICD-10-CM | POA: Diagnosis not present

## 2023-04-26 DIAGNOSIS — G35 Multiple sclerosis: Secondary | ICD-10-CM | POA: Diagnosis not present

## 2023-04-26 DIAGNOSIS — Z801 Family history of malignant neoplasm of trachea, bronchus and lung: Secondary | ICD-10-CM | POA: Insufficient documentation

## 2023-04-26 DIAGNOSIS — C3411 Malignant neoplasm of upper lobe, right bronchus or lung: Secondary | ICD-10-CM

## 2023-04-26 HISTORY — PX: BRONCHIAL NEEDLE ASPIRATION BIOPSY: SHX5106

## 2023-04-26 HISTORY — DX: Gout, unspecified: M10.9

## 2023-04-26 HISTORY — PX: FIDUCIAL MARKER PLACEMENT: SHX6858

## 2023-04-26 HISTORY — PX: BRONCHIAL BIOPSY: SHX5109

## 2023-04-26 HISTORY — PX: BRONCHIAL BRUSHINGS: SHX5108

## 2023-04-26 LAB — COMPREHENSIVE METABOLIC PANEL
ALT: 21 U/L (ref 0–44)
AST: 26 U/L (ref 15–41)
Albumin: 3.7 g/dL (ref 3.5–5.0)
Alkaline Phosphatase: 74 U/L (ref 38–126)
Anion gap: 9 (ref 5–15)
BUN: 19 mg/dL (ref 8–23)
CO2: 23 mmol/L (ref 22–32)
Calcium: 9.2 mg/dL (ref 8.9–10.3)
Chloride: 108 mmol/L (ref 98–111)
Creatinine, Ser: 1.18 mg/dL (ref 0.61–1.24)
GFR, Estimated: >60 mL/min (ref >60)
Glucose, Bld: 98 mg/dL (ref 70–99)
Potassium: 4.1 mmol/L (ref 3.5–5.1)
Sodium: 140 mmol/L (ref 135–145)
Total Bilirubin: 0.8 mg/dL (ref 0.0–1.2)
Total Protein: 6.6 g/dL (ref 6.5–8.1)

## 2023-04-26 LAB — CBC
HCT: 44 % (ref 39.0–52.0)
Hemoglobin: 14.9 g/dL (ref 13.0–17.0)
MCH: 32 pg (ref 26.0–34.0)
MCHC: 33.9 g/dL (ref 30.0–36.0)
MCV: 94.6 fL (ref 80.0–100.0)
Platelets: 250 10*3/uL (ref 150–400)
RBC: 4.65 MIL/uL (ref 4.22–5.81)
RDW: 13.7 % (ref 11.5–15.5)
WBC: 7.1 10*3/uL (ref 4.0–10.5)
nRBC: 0 % (ref 0.0–0.2)

## 2023-04-26 SURGERY — BRONCHOSCOPY, WITH BIOPSY USING ELECTROMAGNETIC NAVIGATION
Anesthesia: General | Laterality: Right

## 2023-04-26 MED ORDER — DEXAMETHASONE SODIUM PHOSPHATE 10 MG/ML IJ SOLN
INTRAMUSCULAR | Status: DC | PRN
  Administered 2023-04-26: 10 mg via INTRAVENOUS

## 2023-04-26 MED ORDER — PHENYLEPHRINE 80 MCG/ML (10ML) SYRINGE FOR IV PUSH (FOR BLOOD PRESSURE SUPPORT)
PREFILLED_SYRINGE | INTRAVENOUS | Status: DC | PRN
  Administered 2023-04-26: 80 ug via INTRAVENOUS

## 2023-04-26 MED ORDER — SUGAMMADEX SODIUM 200 MG/2ML IV SOLN
INTRAVENOUS | Status: DC | PRN
  Administered 2023-04-26: 152.4 mg via INTRAVENOUS

## 2023-04-26 MED ORDER — LIDOCAINE 2% (20 MG/ML) 5 ML SYRINGE
INTRAMUSCULAR | Status: DC | PRN
  Administered 2023-04-26: 60 mg via INTRAVENOUS

## 2023-04-26 MED ORDER — LACTATED RINGERS IV SOLN
INTRAVENOUS | Status: DC
Start: 2023-04-26 — End: 2023-04-26

## 2023-04-26 MED ORDER — SODIUM CHLORIDE 0.9 % IV SOLN: INTRAVENOUS | Status: DC | PRN

## 2023-04-26 MED ORDER — FENTANYL CITRATE (PF) 100 MCG/2ML IJ SOLN: 25.0000 ug | INTRAMUSCULAR | Status: DC | PRN

## 2023-04-26 MED ORDER — ONDANSETRON HCL 4 MG/2ML IJ SOLN
INTRAMUSCULAR | Status: DC | PRN
  Administered 2023-04-26: 4 mg via INTRAVENOUS

## 2023-04-26 MED ORDER — CHLORHEXIDINE GLUCONATE 0.12 % MT SOLN
15.0000 mL | Freq: Once | OROMUCOSAL | Status: AC
  Administered 2023-04-26: 15 mL via OROMUCOSAL
  Filled 2023-04-26: qty 15

## 2023-04-26 MED ORDER — ROCURONIUM BROMIDE 10 MG/ML (PF) SYRINGE
PREFILLED_SYRINGE | INTRAVENOUS | Status: DC | PRN
  Administered 2023-04-26: 50 mg via INTRAVENOUS
  Administered 2023-04-26: 20 mg via INTRAVENOUS

## 2023-04-26 MED ORDER — PROPOFOL 10 MG/ML IV BOLUS
INTRAVENOUS | Status: DC | PRN
  Administered 2023-04-26: 30 mg via INTRAVENOUS
  Administered 2023-04-26: 125 ug/kg/min via INTRAVENOUS
  Administered 2023-04-26: 100 mg via INTRAVENOUS

## 2023-04-26 SURGICAL SUPPLY — 1 items: Super Lock Fiducial Marker IMPLANT

## 2023-04-26 NOTE — Op Note (Addendum)
Video Bronchoscopy with Robotic Assisted Bronchoscopic Navigation   Date of Operation: 04/26/2023   Pre-op Diagnosis: Right upper lobe nodule  Post-op Diagnosis: Same  Surgeon: Levy Pupa  Assistants: None  Anesthesia: General endotracheal anesthesia  Operation: Flexible video fiberoptic bronchoscopy with robotic assistance and biopsies.  Estimated Blood Loss: Minimal  Complications: None  Indications and History: Kevin Moyer is a 78 y.o. male with history of tobacco use, family history of lung cancer, multiple sclerosis, followed at the [place].  He had an MRI that showed a possible right upper lobe pulmonary nodule.  This was confirmed on subsequent CT scan of the chest.  Recommendation made to achieve a tissue diagnosis via robotic assisted navigational bronchoscopy. The risks, benefits, complications, treatment options and expected outcomes were discussed with the patient.  The possibilities of pneumothorax, pneumonia, reaction to medication, pulmonary aspiration, perforation of a viscus, bleeding, failure to diagnose a condition and creating a complication requiring transfusion or operation were discussed with the patient who freely signed the consent.    Description of Procedure: The patient was seen in the Preoperative Area, was examined and was deemed appropriate to proceed.  The patient was taken to [facility] endoscopy room 3, identified as Kevin Moyer and the procedure verified as Flexible Video Fiberoptic Bronchoscopy.  A Time Out was held and the above information confirmed.   Prior to the date of the procedure a high-resolution CT scan of the chest was performed. Utilizing ION software program a virtual tracheobronchial tree was generated to allow the creation of distinct navigation pathways to the patient's parenchymal abnormalities. After being taken to the operating room general anesthesia was initiated and the patient  was orally intubated. The video fiberoptic bronchoscope was  introduced via the endotracheal tube and a general inspection was performed which showed normal right and left lung anatomy. Aspiration of the bilateral mainstems was completed to remove any remaining secretions. Robotic catheter inserted into patient's endotracheal tube.   Target #1 right upper lobe nodule: The distinct navigation pathways prepared prior to this procedure were then utilized to navigate to patient's lesion identified on CT scan. The robotic catheter was secured into place and the vision probe was withdrawn.  Lesion location was approximated using fluoroscopy.  Local registration and targeting was performed using Cios three-dimensional imaging. Under fluoroscopic guidance transbronchial needle brushings, transbronchial needle biopsies, and transbronchial forceps biopsies were performed to be sent for cytology and pathology.  Under fluoroscopic guidance a single fiducial marker was placed adjacent to the nodule.  At the end of the procedure a general airway inspection was performed and there was no evidence of active bleeding. The bronchoscope was removed.  The patient tolerated the procedure well. There was no significant blood loss and there were no obvious complications. A post-procedural chest x-ray is pending.  Samples Target #1: 1. Transbronchial needle brushings from right upper lobe nodule 2. Transbronchial Wang needle biopsies from right upper lobe nodule 3. Transbronchial forceps biopsies from right upper lobe nodule   Plans:  The patient will be discharged from the PACU to home when recovered from anesthesia and after chest x-ray is reviewed. We will review the cytology, pathology and microbiology results with the patient when they become available. Outpatient followup will be with Saralyn Pilar, NP.    Levy Pupa, MD, PhD 04/26/2023, 11:50 AM [facility] [phone] or if no answer before 7:00PM call [phone] For any issues after 7:00PM please  call eLink [phone]

## 2023-04-26 NOTE — Anesthesia Postprocedure Evaluation (Signed)
Anesthesia Post Note  Patient: Kevin Moyer  Procedure(s) Performed: ROBOTIC ASSISTED NAVIGATIONAL BRONCHOSCOPY WITH FLUORO (Right) BRONCHIAL BIOPSIES BRONCHIAL NEEDLE ASPIRATION BIOPSIES BRONCHIAL BRUSHINGS FIDUCIAL MARKER PLACEMENT     Patient location during evaluation: PACU Anesthesia Type: General Level of consciousness: awake and alert, oriented and patient cooperative Pain management: pain level controlled Vital Signs Assessment: post-procedure vital signs reviewed and stable Respiratory status: spontaneous breathing, nonlabored ventilation and respiratory function stable Cardiovascular status: blood pressure returned to baseline and stable Postop Assessment: no apparent nausea or vomiting Anesthetic complications: no   No notable events documented.  Last Vitals:  Vitals:   04/26/23 1215 04/26/23 1230  BP: 117/65 (!) 147/76  Pulse: 61 (!) 59  Resp: 15 19  Temp:  (!) 36.3 C  SpO2: 94% 95%    Last Pain:  Vitals:   04/26/23 1230  PainSc: 0-No pain                 Tennis Must [name] [name]

## 2023-04-26 NOTE — Discharge Instructions (Addendum)
Flexible Bronchoscopy, Care After This sheet gives you information about how to care for yourself after your test. Your doctor may also give you more specific instructions. If you have problems or questions, contact your doctor. Follow these instructions at home: Eating and drinking When your numbness is gone and your cough and gag reflexes have come back, you may: Eat only soft foods. Slowly drink liquids. When you get home after the test, go back to your normal diet. Driving Do not drive for 24 hours if you were given a medicine to help you relax (sedative). Do not drive or use heavy machinery while taking prescription pain medicine. General instructions  Take over-the-counter and prescription medicines only as told by your doctor. Return to your normal activities as told. Ask what activities are safe for you. Do not use any products that have nicotine or tobacco in them. This includes cigarettes and e-cigarettes. If you need help quitting, ask your doctor. Keep all follow-up visits as told by your doctor. This is important. It is very important if you had a tissue sample (biopsy) taken. Get help right away if: You have shortness of breath that gets worse. You get light-headed. You feel like you are going to pass out (faint). You have chest pain. You cough up: More than a little blood. More blood than before. Summary Do not eat or drink anything (not even water) for 2 hours after your test, or until your numbing medicine wears off. Do not use cigarettes. Do not use e-cigarettes. Get help right away if you have chest pain.  Please call our office for any questions or concerns.  [phone].  Okay to restart your aspirin on 04/27/23.   This information is not intended to replace advice given to you by your health care provider. Make sure you discuss any questions you have with your health care provider. Document Released: 01/04/2009 Document Revised: 02/19/2017 Document Reviewed:  03/27/2016 Elsevier Patient Education  2020 [employer].

## 2023-04-26 NOTE — Anesthesia Procedure Notes (Signed)
Procedure Name: Intubation Date/Time: 04/26/2023 11:13 AM  Performed by: Venia Carbon, CRNAPre-anesthesia Checklist: Patient identified, Emergency Drugs available, Suction available, Patient being monitored and Timeout performed Patient Re-evaluated:Patient Re-evaluated prior to induction Oxygen Delivery Method: Circle system utilized Preoxygenation: Pre-oxygenation with 100% oxygen Induction Type: IV induction Ventilation: Mask ventilation without difficulty Laryngoscope Size: Glidescope and 4 Grade View: Grade I Tube type: Oral Tube size: 8.5 mm Number of attempts: 1 Airway Equipment and Method: Patient positioned with wedge pillow and Video-laryngoscopy Placement Confirmation: ETT inserted through vocal cords under direct vision, positive ETCO2, CO2 detector and breath sounds checked- equal and bilateral Secured at: 23 cm Tube secured with: Tape Comments: Easy mask ventialtion, Grade III view with MAC 3, grade 1 view with glidescope 3

## 2023-04-26 NOTE — Transfer of Care (Signed)
Immediate Anesthesia Transfer of Care Note  Patient: Kevin Moyer  Procedure(s) Performed: ROBOTIC ASSISTED NAVIGATIONAL BRONCHOSCOPY WITH FLUORO (Right) BRONCHIAL BIOPSIES BRONCHIAL NEEDLE ASPIRATION BIOPSIES BRONCHIAL BRUSHINGS FIDUCIAL MARKER PLACEMENT  Patient Location: PACU  Anesthesia Type:General  Level of Consciousness: awake, alert , oriented, patient cooperative, and responds to stimulation  Airway & Oxygen Therapy: Patient Spontanous Breathing and Patient connected to face mask  Post-op Assessment: Report given to RN, Post -op Vital signs reviewed and stable, Patient moving all extremities, Patient moving all extremities X 4, and Patient able to stick tongue midline  Post vital signs: Reviewed and stable  Last Vitals:  Vitals Value Taken Time  BP 123/72 04/26/23 1156  Temp 36.3 C 04/26/23 1156  Pulse 60 04/26/23 1158  Resp 14 04/26/23 1158  SpO2 97 % 04/26/23 1158  Vitals shown include unfiled device data.  Last Pain:  Vitals:   04/26/23 0924  PainSc: 0-No pain         Complications: No notable events documented.

## 2023-04-26 NOTE — Interval H&P Note (Signed)
History and Physical Interval Note:  04/26/2023 9:18 AM  Kevin Moyer  has presented today for surgery, with the diagnosis of RIGHT UPPER LOBE NODULE.  The various methods of treatment have been discussed with the patient and family. After consideration of risks, benefits and other options for treatment, the patient has consented to  Procedure(s): ROBOTIC ASSISTED NAVIGATIONAL BRONCHOSCOPY WITH FLUORO (Right) as a surgical intervention.  The patient's history has been reviewed, patient examined, no change in status, stable for surgery.  I have reviewed the patient's chart and labs.  Questions were answered to the patient's satisfaction.     Leslye Peer

## 2023-04-28 LAB — CYTOLOGY - NON PAP

## 2023-04-30 ENCOUNTER — Telehealth: Payer: Self-pay | Admitting: Emergency Medicine

## 2023-04-30 NOTE — Telephone Encounter (Signed)
 Dr. Baldwin Levee- I have canceled 2/11 appointment, do we need to schedule him for another appointment later in the week?

## 2023-04-30 NOTE — Telephone Encounter (Signed)
 Spoke with the patient to review bronchoscopy results, showed squamous cell lung cancer.  He has seen radiation oncology at [facility], has plans to see thoracic surgery in [facility] next week.  Then he will decide which therapeutic approach to take.  His PET scan was done through the [facility] in [place].  He will need to follow-up with us  next week as plans are all in place.  Please cancel his appointment with SG on 2/11.  Thank you

## 2023-05-03 NOTE — Telephone Encounter (Signed)
 No we will arrange follow up later depending on his other referrals

## 2023-05-04 ENCOUNTER — Ambulatory Visit: Payer: Medicare HMO | Admitting: Acute Care

## 2023-05-06 ENCOUNTER — Other Ambulatory Visit: Payer: Self-pay

## 2023-05-07 NOTE — Progress Notes (Signed)
The proposed treatment discussed in conference is for discussion purpose only and is not a binding recommendation.  The patients have not been physically examined, or presented with their treatment options.  Therefore, final treatment plans cannot be decided.

## 2023-05-11 NOTE — Progress Notes (Signed)
 Recommendation from TB conference on 05/06/2023 faxed to Aileen Pilot, [facility] at [phone].
